# Patient Record
Sex: Male | Born: 1945 | Race: White | Hispanic: No | Marital: Married | Smoking: Former smoker
Health system: Southern US, Community
[De-identification: ages and names within clinical notes are randomized; demographics above are authoritative.]

## PROBLEM LIST (undated history)

## (undated) DIAGNOSIS — Z8489 Family history of other specified conditions: Secondary | ICD-10-CM

## (undated) DIAGNOSIS — Z955 Presence of coronary angioplasty implant and graft: Secondary | ICD-10-CM

## (undated) DIAGNOSIS — Z974 Presence of external hearing-aid: Secondary | ICD-10-CM

## (undated) DIAGNOSIS — K219 Gastro-esophageal reflux disease without esophagitis: Secondary | ICD-10-CM

## (undated) DIAGNOSIS — I2510 Atherosclerotic heart disease of native coronary artery without angina pectoris: Secondary | ICD-10-CM

## (undated) DIAGNOSIS — E785 Hyperlipidemia, unspecified: Secondary | ICD-10-CM

## (undated) DIAGNOSIS — I219 Acute myocardial infarction, unspecified: Secondary | ICD-10-CM

## (undated) DIAGNOSIS — I10 Essential (primary) hypertension: Secondary | ICD-10-CM

## (undated) DIAGNOSIS — H9190 Unspecified hearing loss, unspecified ear: Secondary | ICD-10-CM

## (undated) DIAGNOSIS — M353 Polymyalgia rheumatica: Secondary | ICD-10-CM

## (undated) DIAGNOSIS — Z8619 Personal history of other infectious and parasitic diseases: Secondary | ICD-10-CM

## (undated) HISTORY — PX: CARPAL TUNNEL RELEASE: SHX101

## (undated) HISTORY — PX: CORONARY ANGIOPLASTY: SHX604

## (undated) HISTORY — PX: COLONOSCOPY: SHX174

---

## 2019-04-28 ENCOUNTER — Ambulatory Visit
Admission: RE | Admit: 2019-04-28 | Discharge: 2019-04-28 | Disposition: A | Discharge status: Home / Self Care | Payer: Medicare PPO | Source: Ambulatory Visit | Attending: Family Medicine | Admitting: Family Medicine

## 2019-04-28 ENCOUNTER — Other Ambulatory Visit: Payer: Self-pay

## 2019-04-28 ENCOUNTER — Ambulatory Visit
Admission: RE | Admit: 2019-04-28 | Discharge: 2019-04-28 | Disposition: A | Discharge status: Home / Self Care | Payer: Medicare PPO | Attending: Family Medicine | Admitting: Family Medicine

## 2019-04-28 ENCOUNTER — Other Ambulatory Visit: Payer: Self-pay | Admitting: Family Medicine

## 2019-04-28 DIAGNOSIS — G8929 Other chronic pain: Secondary | ICD-10-CM

## 2019-04-28 DIAGNOSIS — M25511 Pain in right shoulder: Secondary | ICD-10-CM | POA: Insufficient documentation

## 2020-02-14 ENCOUNTER — Encounter: Payer: Self-pay | Admitting: Ophthalmology

## 2020-02-14 ENCOUNTER — Other Ambulatory Visit: Payer: Self-pay

## 2020-02-17 ENCOUNTER — Other Ambulatory Visit
Admission: RE | Admit: 2020-02-17 | Discharge: 2020-02-17 | Disposition: A | Discharge status: Home / Self Care | Payer: Medicare Other | Source: Ambulatory Visit | Attending: Ophthalmology | Admitting: Ophthalmology

## 2020-02-17 ENCOUNTER — Other Ambulatory Visit: Payer: Self-pay

## 2020-02-17 DIAGNOSIS — Z20822 Contact with and (suspected) exposure to covid-19: Secondary | ICD-10-CM | POA: Diagnosis not present

## 2020-02-17 DIAGNOSIS — Z01812 Encounter for preprocedural laboratory examination: Secondary | ICD-10-CM | POA: Diagnosis present

## 2020-02-17 LAB — SARS CORONAVIRUS 2 (TAT 6-24 HRS): SARS Coronavirus 2: NEGATIVE

## 2020-02-17 NOTE — Discharge Instructions (Signed)

## 2020-02-21 ENCOUNTER — Encounter: Payer: Self-pay | Admitting: Ophthalmology

## 2020-02-21 ENCOUNTER — Other Ambulatory Visit: Payer: Self-pay

## 2020-02-21 ENCOUNTER — Ambulatory Visit
Admission: RE | Admit: 2020-02-21 | Discharge: 2020-02-21 | Disposition: A | Discharge status: Home / Self Care | Payer: Medicare Other | Attending: Ophthalmology | Admitting: Ophthalmology

## 2020-02-21 ENCOUNTER — Ambulatory Visit: Payer: Medicare Other | Admitting: Anesthesiology

## 2020-02-21 ENCOUNTER — Encounter
Admission: RE | Disposition: A | Discharge status: Home / Self Care | Payer: Self-pay | Source: Home / Self Care | Attending: Ophthalmology

## 2020-02-21 DIAGNOSIS — H9190 Unspecified hearing loss, unspecified ear: Secondary | ICD-10-CM | POA: Insufficient documentation

## 2020-02-21 DIAGNOSIS — I2510 Atherosclerotic heart disease of native coronary artery without angina pectoris: Secondary | ICD-10-CM | POA: Insufficient documentation

## 2020-02-21 DIAGNOSIS — E785 Hyperlipidemia, unspecified: Secondary | ICD-10-CM | POA: Diagnosis not present

## 2020-02-21 DIAGNOSIS — Z955 Presence of coronary angioplasty implant and graft: Secondary | ICD-10-CM | POA: Diagnosis not present

## 2020-02-21 DIAGNOSIS — Z7982 Long term (current) use of aspirin: Secondary | ICD-10-CM | POA: Insufficient documentation

## 2020-02-21 DIAGNOSIS — Z79899 Other long term (current) drug therapy: Secondary | ICD-10-CM | POA: Insufficient documentation

## 2020-02-21 DIAGNOSIS — Z87891 Personal history of nicotine dependence: Secondary | ICD-10-CM | POA: Insufficient documentation

## 2020-02-21 DIAGNOSIS — I252 Old myocardial infarction: Secondary | ICD-10-CM | POA: Insufficient documentation

## 2020-02-21 DIAGNOSIS — Z791 Long term (current) use of non-steroidal anti-inflammatories (NSAID): Secondary | ICD-10-CM | POA: Diagnosis not present

## 2020-02-21 DIAGNOSIS — K219 Gastro-esophageal reflux disease without esophagitis: Secondary | ICD-10-CM | POA: Diagnosis not present

## 2020-02-21 DIAGNOSIS — H2511 Age-related nuclear cataract, right eye: Secondary | ICD-10-CM | POA: Diagnosis present

## 2020-02-21 DIAGNOSIS — I10 Essential (primary) hypertension: Secondary | ICD-10-CM | POA: Insufficient documentation

## 2020-02-21 DIAGNOSIS — M353 Polymyalgia rheumatica: Secondary | ICD-10-CM | POA: Diagnosis not present

## 2020-02-21 HISTORY — DX: Hyperlipidemia, unspecified: E78.5

## 2020-02-21 HISTORY — DX: Presence of coronary angioplasty implant and graft: Z95.5

## 2020-02-21 HISTORY — DX: Personal history of other infectious and parasitic diseases: Z86.19

## 2020-02-21 HISTORY — DX: Presence of external hearing-aid: Z97.4

## 2020-02-21 HISTORY — DX: Polymyalgia rheumatica: M35.3

## 2020-02-21 HISTORY — DX: Gastro-esophageal reflux disease without esophagitis: K21.9

## 2020-02-21 HISTORY — PX: CATARACT EXTRACTION W/PHACO: SHX586

## 2020-02-21 HISTORY — DX: Acute myocardial infarction, unspecified: I21.9

## 2020-02-21 HISTORY — DX: Essential (primary) hypertension: I10

## 2020-02-21 HISTORY — DX: Atherosclerotic heart disease of native coronary artery without angina pectoris: I25.10

## 2020-02-21 HISTORY — DX: Family history of other specified conditions: Z84.89

## 2020-02-21 HISTORY — DX: Unspecified hearing loss, unspecified ear: H91.90

## 2020-02-21 SURGERY — PHACOEMULSIFICATION, CATARACT, WITH IOL INSERTION
Anesthesia: Monitor Anesthesia Care | Site: Eye | Laterality: Right

## 2020-02-21 MED ORDER — FENTANYL CITRATE (PF) 100 MCG/2ML IJ SOLN
INTRAMUSCULAR | Status: DC | PRN
  Administered 2020-02-21: 50 ug via INTRAVENOUS

## 2020-02-21 MED ORDER — SODIUM HYALURONATE 23 MG/ML IO SOLN
INTRAOCULAR | Status: DC | PRN
  Administered 2020-02-21: 0.6 mL via INTRAOCULAR

## 2020-02-21 MED ORDER — ARMC OPHTHALMIC DILATING DROPS
1.0000 "application " | OPHTHALMIC | Status: DC | PRN
  Administered 2020-02-21 (×3): 1 via OPHTHALMIC

## 2020-02-21 MED ORDER — MOXIFLOXACIN HCL 0.5 % OP SOLN
OPHTHALMIC | Status: DC | PRN
  Administered 2020-02-21: 0.2 mL via OPHTHALMIC

## 2020-02-21 MED ORDER — LIDOCAINE HCL (PF) 2 % IJ SOLN
INTRAOCULAR | Status: DC | PRN
  Administered 2020-02-21: 1 mL via INTRAOCULAR

## 2020-02-21 MED ORDER — SODIUM HYALURONATE 10 MG/ML IO SOLN
INTRAOCULAR | Status: DC | PRN
  Administered 2020-02-21: 0.55 mL via INTRAOCULAR

## 2020-02-21 MED ORDER — EPINEPHRINE PF 1 MG/ML IJ SOLN
INTRAOCULAR | Status: DC | PRN
  Administered 2020-02-21: 62 mL via OPHTHALMIC

## 2020-02-21 MED ORDER — LACTATED RINGERS IV SOLN: INTRAVENOUS | Status: DC

## 2020-02-21 MED ORDER — MIDAZOLAM HCL 2 MG/2ML IJ SOLN
INTRAMUSCULAR | Status: DC | PRN
  Administered 2020-02-21 (×2): 1 mg via INTRAVENOUS

## 2020-02-21 MED ORDER — TETRACAINE HCL 0.5 % OP SOLN
1.0000 [drp] | OPHTHALMIC | Status: DC | PRN
  Administered 2020-02-21 (×3): 1 [drp] via OPHTHALMIC

## 2020-02-21 SURGICAL SUPPLY — 19 items
CANNULA ANT/CHMB 27GA (MISCELLANEOUS) ×4 IMPLANT
DISSECTOR HYDRO NUCLEUS 50X22 (MISCELLANEOUS) ×2 IMPLANT
GLOVE SURG LX 7.5 STRW (GLOVE) ×1
GLOVE SURG LX STRL 7.5 STRW (GLOVE) ×1 IMPLANT
GLOVE SURG SYN 8.5  E (GLOVE) ×1
GLOVE SURG SYN 8.5 E (GLOVE) ×1 IMPLANT
GOWN STRL REUS W/ TWL LRG LVL3 (GOWN DISPOSABLE) ×2 IMPLANT
GOWN STRL REUS W/TWL LRG LVL3 (GOWN DISPOSABLE) ×4
LENS IOL EYHANCE TORIC II 17.5 ×2 IMPLANT
LENS IOL EYHANCE TRC 150 17.5 ×1 IMPLANT
LENS IOL EYHNC TORIC 150 17.5 ×1 IMPLANT
MARKER SKIN DUAL TIP RULER LAB (MISCELLANEOUS) ×2 IMPLANT
PACK DR. KING ARMS (PACKS) ×2 IMPLANT
PACK EYE AFTER SURG (MISCELLANEOUS) ×2 IMPLANT
PACK OPTHALMIC (MISCELLANEOUS) ×2 IMPLANT
SYR 3ML LL SCALE MARK (SYRINGE) ×2 IMPLANT
SYR TB 1ML LUER SLIP (SYRINGE) ×2 IMPLANT
WATER STERILE IRR 250ML POUR (IV SOLUTION) ×2 IMPLANT
WIPE NON LINTING 3.25X3.25 (MISCELLANEOUS) ×2 IMPLANT

## 2020-02-21 NOTE — Anesthesia Procedure Notes (Signed)
Procedure Name: MAC Date/Time: 02/21/2020 8:31 AM Performed by: Georga Bora, CRNA Pre-anesthesia Checklist: Patient identified, Emergency Drugs available, Suction available, Patient being monitored and Timeout performed Oxygen Delivery Method: Nasal cannula

## 2020-02-21 NOTE — Anesthesia Postprocedure Evaluation (Signed)
Anesthesia Post Note  Patient: Corey Rice  Procedure(s) Performed: CATARACT EXTRACTION PHACO AND INTRAOCULAR LENS PLACEMENT (IOC) RIGHT EYHANCE TORIC 2.39  00:25.2 (Right Eye)     Patient location during evaluation: PACU Anesthesia Type: MAC Level of consciousness: awake and alert Pain management: pain level controlled Vital Signs Assessment: post-procedure vital signs reviewed and stable Respiratory status: spontaneous breathing Cardiovascular status: blood pressure returned to baseline Postop Assessment: no apparent nausea or vomiting, adequate PO intake and no headache Anesthetic complications: no   No complications documented.  Adele Barthel Raechel Marcos

## 2020-02-21 NOTE — Transfer of Care (Signed)
Immediate Anesthesia Transfer of Care Note  Patient: Corey Rice  Procedure(s) Performed: CATARACT EXTRACTION PHACO AND INTRAOCULAR LENS PLACEMENT (IOC) RIGHT EYHANCE TORIC 2.39  00:25.2 (Right Eye)  Patient Location: PACU  Anesthesia Type: MAC  Level of Consciousness: awake, alert  and patient cooperative  Airway and Oxygen Therapy: Patient Spontanous Breathing and Patient connected to supplemental oxygen  Post-op Assessment: Post-op Vital signs reviewed, Patient's Cardiovascular Status Stable, Respiratory Function Stable, Patent Airway and No signs of Nausea or vomiting  Post-op Vital Signs: Reviewed and stable  Complications: No complications documented.

## 2020-02-21 NOTE — H&P (Signed)
[name]   Primary Care Physician:  Rayetta Humphrey, MD Ophthalmologist: Dr. Willey Blade  Pre-Procedure History & Physical: HPI:  Corey Rice is a 74 y.o. male here for cataract surgery.   Past Medical History:  Diagnosis Date  . Coronary artery disease   . Family history of adverse reaction to anesthesia    Son - rash  . GERD (gastroesophageal reflux disease)   . History of shingles   . HOH (hard of hearing)   . Hyperlipidemia   . Hypertension   . Myocardial infarction (HCC)   . Polymyalgia rheumatica (HCC)   . Stented coronary artery   . Wears hearing aid in both ears     Past Surgical History:  Procedure Laterality Date  . CARPAL TUNNEL RELEASE    . COLONOSCOPY    . CORONARY ANGIOPLASTY     stent    Prior to Admission medications   Medication Sig Start Date End Date Taking? Authorizing Provider  ACETAMINOPHEN PO Take by mouth as needed.   Yes [provider]  amLODipine (NORVASC) 10 MG tablet Take 10 mg by mouth daily.   Yes [provider]  ASPIRIN 81 PO Take by mouth daily.   Yes [provider]  atorvastatin (LIPITOR) 20 MG tablet Take 20 mg by mouth daily.   Yes [provider]  carvedilol (COREG) 3.125 MG tablet Take 3.125 mg by mouth 2 (two) times daily with a meal.   Yes [provider]  Chondroitin Sulfate 400 MG CAPS Take 1,200 mg by mouth 2 (two) times daily.   Yes [provider]  Coenzyme Q10 (CO Q-10) 100 MG CAPS Take by mouth 2 (two) times daily.   Yes [provider]  DIPHENHYDRAMINE HCL PO Take by mouth as needed.   Yes [provider]  dutasteride (AVODART) 0.5 MG capsule Take 0.5 mg by mouth daily.   Yes [provider]  ezetimibe (ZETIA) 10 MG tablet Take 10 mg by mouth daily.   Yes [provider]  Glucosamine Sulfate 1500 MG PACK Take by mouth 2 (two) times daily.   Yes [provider]  ibuprofen (ADVIL) 600 MG tablet Take 600 mg by  mouth 3 (three) times daily.   Yes [provider]  losartan (COZAAR) 100 MG tablet Take 100 mg by mouth daily.   Yes [provider]  Multiple Vitamins-Minerals (PRESERVISION AREDS PO) Take by mouth 2 (two) times daily.   Yes [provider]  Omega-3 Fatty Acids (FISH OIL PO) Take 300 mg by mouth daily.   Yes [provider]  omeprazole (PRILOSEC) 20 MG capsule Take 20 mg by mouth daily.   Yes [provider]  PSEUDOEPHEDRINE HCL PO Take by mouth as needed.   Yes [provider]  Saw Palmetto, Serenoa repens, (SAW PALMETTO PO) Take by mouth 2 (two) times daily.   Yes [provider]  Vitamin D, Cholecalciferol, 10 MCG (400 UNIT) TABS Take by mouth daily.   Yes [provider]    Allergies as of 01/06/2020  . (Not on File)    History reviewed. No pertinent family history.  Social History   Socioeconomic History  . Marital status: Married    Spouse name: Not on file  . Number of children: Not on file  . Years of education: Not on file  . Highest education level: Not on file  Occupational History  . Not on file  Tobacco Use  . Smoking status: Former  Smoker    Quit date: 02/2019    Years since quitting: 1.0  . Smokeless tobacco: Never Used  Vaping Use  . Vaping Use: Never used  Substance and Sexual Activity  . Alcohol use: Yes    Alcohol/week: 21.0 standard drinks    Types: 7 Glasses of wine, 14 Shots of liquor per week  . Drug use: Not on file  . Sexual activity: Not on file  Other Topics Concern  . Not on file  Social History Narrative  . Not on file   Social Determinants of Health   Financial Resource Strain:   . Difficulty of Paying Living Expenses: Not on file  Food Insecurity:   . Worried About Programme researcher, broadcasting/film/video in the Last Year: Not on file  . Ran Out of Food in the Last Year: Not on file  Transportation Needs:   . Lack of Transportation (Medical): Not on file  . Lack of Transportation  (Non-Medical): Not on file  Physical Activity:   . Days of Exercise per Week: Not on file  . Minutes of Exercise per Session: Not on file  Stress:   . Feeling of Stress : Not on file  Social Connections:   . Frequency of Communication with Friends and Family: Not on file  . Frequency of Social Gatherings with Friends and Family: Not on file  . Attends Religious Services: Not on file  . Active Member of Clubs or Organizations: Not on file  . Attends Banker Meetings: Not on file  . Marital Status: Not on file  Intimate Partner Violence:   . Fear of Current or Ex-Partner: Not on file  . Emotionally Abused: Not on file  . Physically Abused: Not on file  . Sexually Abused: Not on file    Review of Systems: See HPI, otherwise negative ROS  Physical Exam: BP (!) 174/82   Pulse (!) 52   Temp (!) 97.3 F (36.3 C) (Temporal)   Resp 16   Ht 5\' 5"  (1.651 m)   Wt 77.6 kg   SpO2 97%   BMI 28.46 kg/m  General:   Alert,  pleasant and cooperative in NAD Head:  Normocephalic and atraumatic.  Impression/Plan: Corey Rice is here for cataract surgery.  Risks, benefits, limitations, and alternatives regarding cataract surgery have been reviewed with the patient.  Questions have been answered.  All parties agreeable.   Joaquim Nam, MD  02/21/2020, 8:17 AM

## 2020-02-21 NOTE — Op Note (Signed)
OPERATIVE NOTE  Corey Rice [id number] 02/21/2020   PREOPERATIVE DIAGNOSIS:  Nuclear sclerotic cataract right eye.  H25.11   POSTOPERATIVE DIAGNOSIS:    Nuclear sclerotic cataract right eye.     PROCEDURE:  Phacoemusification with posterior chamber intraocular lens placement of the right eye   LENS:   Implant Name Type Inv. Item Serial No. Manufacturer Lot No. LRB No. Used Action  Tecnis Eyhance Toric II IOL Intraocular Lens  [id number]   Right 1 Implanted       Procedure(s): CATARACT EXTRACTION PHACO AND INTRAOCULAR LENS PLACEMENT (IOC) RIGHT EYHANCE TORIC 2.39  00:25.2 (Right)  DIU150 +17.5 (1.5D) @ 001   ULTRASOUND TIME: 0 minutes 25 seconds.  CDE 2.39   SURGEON:  Willey Blade, MD, MPH  ANESTHESIOLOGIST: Anesthesiologist: Page, Wille Celeste, MD CRNA: Arlice Colt, CRNA   ANESTHESIA:  Topical with tetracaine drops augmented with 1% preservative-free intracameral lidocaine.  ESTIMATED BLOOD LOSS: less than 1 mL.   COMPLICATIONS:  None.   DESCRIPTION OF PROCEDURE:  The patient was identified in the holding room and transported to the operating room and placed in the supine position under the operating microscope.  The right eye was identified as the operative eye and it was prepped and draped in the usual sterile ophthalmic fashion.  The VERION system was registered without difficulty.   A 1.0 millimeter clear-corneal paracentesis was made at the 10:30 position. 0.5 ml of preservative-free 1% lidocaine with epinephrine was injected into the anterior chamber.  The anterior chamber was filled with Healon 5 viscoelastic.  A 2.4 millimeter keratome was used to make a near-clear corneal incision at the 8:00 position.  A curvilinear capsulorrhexis was made with a cystotome and capsulorrhexis forceps.  Balanced salt solution was used to hydrodissect and hydrodelineate the nucleus.   Phacoemulsification was then used in stop and chop fashion to remove the lens nucleus and  epinucleus.  The remaining cortex was then removed using the irrigation and aspiration handpiece. Healon was then placed into the capsular bag to distend it for lens placement.  A lens was then injected into the capsular bag.  The remaining viscoelastic was aspirated.  The lens was rotated to 001 degrees with assistance for alignment from the VERION system.   Wounds were hydrated with balanced salt solution.  The anterior chamber was inflated to a physiologic pressure with balanced salt solution.   Intracameral vigamox 0.1 mL undiluted was injected into the eye and a drop placed onto the ocular surface.  No wound leaks were noted.  The patient was taken to the recovery room in stable condition without complications of anesthesia or surgery  Willey Blade 02/21/2020, 8:51 AM

## 2020-02-21 NOTE — Anesthesia Preprocedure Evaluation (Signed)
Anesthesia Evaluation    Airway Mallampati: III  TM Distance: >3 FB Neck ROM: Full    Dental no notable dental hx.    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, symptoms well controlled   Abdominal   Peds  Hematology   Anesthesia Other Findings   Reproductive/Obstetrics                             Anesthesia Physical Anesthesia Plan  ASA: III  Anesthesia Plan: MAC   Post-op Pain Management:    Induction: Intravenous  PONV Risk Score and Plan: 1 and Midazolam, TIVA and Treatment may vary due to age or medical condition  Airway Management Planned: Nasal Cannula and Natural Airway  Additional Equipment: None  Intra-op Plan:   Post-operative Plan:   Informed Consent: I have reviewed the patients History and Physical, chart, labs and discussed the procedure including the risks, benefits and alternatives for the proposed anesthesia with the patient or authorized representative who has indicated his/her understanding and acceptance.       Plan Discussed with: CRNA  Anesthesia Plan Comments:         Anesthesia Quick Evaluation

## 2020-02-22 ENCOUNTER — Encounter: Payer: Self-pay | Admitting: Ophthalmology

## 2020-03-06 ENCOUNTER — Encounter: Payer: Self-pay | Admitting: Ophthalmology

## 2020-03-06 ENCOUNTER — Other Ambulatory Visit: Payer: Self-pay

## 2020-03-09 ENCOUNTER — Other Ambulatory Visit
Admission: RE | Admit: 2020-03-09 | Discharge: 2020-03-09 | Disposition: A | Discharge status: Home / Self Care | Payer: Medicare Other | Source: Ambulatory Visit | Attending: Ophthalmology | Admitting: Ophthalmology

## 2020-03-09 ENCOUNTER — Other Ambulatory Visit: Payer: Self-pay

## 2020-03-09 DIAGNOSIS — Z01812 Encounter for preprocedural laboratory examination: Secondary | ICD-10-CM | POA: Diagnosis present

## 2020-03-09 DIAGNOSIS — Z20822 Contact with and (suspected) exposure to covid-19: Secondary | ICD-10-CM | POA: Diagnosis not present

## 2020-03-10 LAB — SARS CORONAVIRUS 2 (TAT 6-24 HRS): SARS Coronavirus 2: NEGATIVE

## 2020-03-10 NOTE — Discharge Instructions (Signed)

## 2020-03-13 ENCOUNTER — Ambulatory Visit: Payer: Medicare Other | Admitting: Anesthesiology

## 2020-03-13 ENCOUNTER — Ambulatory Visit
Admission: RE | Admit: 2020-03-13 | Discharge: 2020-03-13 | Disposition: A | Discharge status: Home / Self Care | Payer: Medicare Other | Attending: Ophthalmology | Admitting: Ophthalmology

## 2020-03-13 ENCOUNTER — Other Ambulatory Visit: Payer: Self-pay

## 2020-03-13 ENCOUNTER — Encounter: Payer: Self-pay | Admitting: Ophthalmology

## 2020-03-13 ENCOUNTER — Encounter
Admission: RE | Disposition: A | Discharge status: Home / Self Care | Payer: Self-pay | Source: Home / Self Care | Attending: Ophthalmology

## 2020-03-13 DIAGNOSIS — H2512 Age-related nuclear cataract, left eye: Secondary | ICD-10-CM | POA: Insufficient documentation

## 2020-03-13 DIAGNOSIS — Z87891 Personal history of nicotine dependence: Secondary | ICD-10-CM | POA: Insufficient documentation

## 2020-03-13 HISTORY — PX: CATARACT EXTRACTION W/PHACO: SHX586

## 2020-03-13 SURGERY — PHACOEMULSIFICATION, CATARACT, WITH IOL INSERTION
Anesthesia: Monitor Anesthesia Care | Site: Eye | Laterality: Left

## 2020-03-13 MED ORDER — SODIUM HYALURONATE 23 MG/ML IO SOLN
INTRAOCULAR | Status: DC | PRN
  Administered 2020-03-13: 0.6 mL via INTRAOCULAR

## 2020-03-13 MED ORDER — EPINEPHRINE PF 1 MG/ML IJ SOLN
INTRAOCULAR | Status: DC | PRN
  Administered 2020-03-13: 62 mL via OPHTHALMIC

## 2020-03-13 MED ORDER — TETRACAINE HCL 0.5 % OP SOLN
1.0000 [drp] | OPHTHALMIC | Status: DC | PRN
  Administered 2020-03-13 (×3): 1 [drp] via OPHTHALMIC

## 2020-03-13 MED ORDER — LIDOCAINE HCL (PF) 2 % IJ SOLN
INTRAOCULAR | Status: DC | PRN
  Administered 2020-03-13: 1 mL via INTRAOCULAR

## 2020-03-13 MED ORDER — ARMC OPHTHALMIC DILATING DROPS
1.0000 "application " | OPHTHALMIC | Status: DC | PRN
  Administered 2020-03-13 (×3): 1 via OPHTHALMIC

## 2020-03-13 MED ORDER — MOXIFLOXACIN HCL 0.5 % OP SOLN
OPHTHALMIC | Status: DC | PRN
  Administered 2020-03-13: 0.2 mL via OPHTHALMIC

## 2020-03-13 MED ORDER — MIDAZOLAM HCL 2 MG/2ML IJ SOLN
INTRAMUSCULAR | Status: DC | PRN
  Administered 2020-03-13: 2 mg via INTRAVENOUS

## 2020-03-13 MED ORDER — FENTANYL CITRATE (PF) 100 MCG/2ML IJ SOLN
INTRAMUSCULAR | Status: DC | PRN
Start: 2020-03-13 — End: 2020-03-13
  Administered 2020-03-13 (×2): 50 ug via INTRAVENOUS

## 2020-03-13 MED ORDER — SODIUM HYALURONATE 10 MG/ML IO SOLN
INTRAOCULAR | Status: DC | PRN
  Administered 2020-03-13: 0.55 mL via INTRAOCULAR

## 2020-03-13 MED ORDER — LACTATED RINGERS IV SOLN: INTRAVENOUS | Status: DC

## 2020-03-13 SURGICAL SUPPLY — 19 items
CANNULA ANT/CHMB 27GA (MISCELLANEOUS) ×6 IMPLANT
DISSECTOR HYDRO NUCLEUS 50X22 (MISCELLANEOUS) ×3 IMPLANT
GLOVE SURG LX 7.5 STRW (GLOVE) ×4
GLOVE SURG LX STRL 7.5 STRW (GLOVE) ×2 IMPLANT
GLOVE SURG SYN 8.5  E (GLOVE) ×2
GLOVE SURG SYN 8.5 E (GLOVE) ×1 IMPLANT
GOWN STRL REUS W/ TWL LRG LVL3 (GOWN DISPOSABLE) ×2 IMPLANT
GOWN STRL REUS W/TWL LRG LVL3 (GOWN DISPOSABLE) ×6
LENS IOL EYHANCE TORIC II 18.0 ×3 IMPLANT
LENS IOL EYHANCE TRC 150 18.0 ×1 IMPLANT
LENS IOL EYHNC TORIC 150 18.0 ×1 IMPLANT
MARKER SKIN DUAL TIP RULER LAB (MISCELLANEOUS) ×3 IMPLANT
PACK DR. KING ARMS (PACKS) ×3 IMPLANT
PACK EYE AFTER SURG (MISCELLANEOUS) ×3 IMPLANT
PACK OPTHALMIC (MISCELLANEOUS) ×3 IMPLANT
SYR 3ML LL SCALE MARK (SYRINGE) ×3 IMPLANT
SYR TB 1ML LUER SLIP (SYRINGE) ×3 IMPLANT
WATER STERILE IRR 250ML POUR (IV SOLUTION) ×3 IMPLANT
WIPE NON LINTING 3.25X3.25 (MISCELLANEOUS) ×3 IMPLANT

## 2020-03-13 NOTE — Transfer of Care (Signed)
Immediate Anesthesia Transfer of Care Note  Patient: Corey Rice  Procedure(s) Performed: CATARACT EXTRACTION PHACO AND INTRAOCULAR LENS PLACEMENT (IOC) LEFT DAIBETIC EYHANCE TORIC (Left Eye)  Patient Location: PACU  Anesthesia Type: MAC  Level of Consciousness: awake, alert  and patient cooperative  Airway and Oxygen Therapy: Patient Spontanous Breathing and Patient connected to supplemental oxygen  Post-op Assessment: Post-op Vital signs reviewed, Patient's Cardiovascular Status Stable, Respiratory Function Stable, Patent Airway and No signs of Nausea or vomiting  Post-op Vital Signs: Reviewed and stable  Complications: No complications documented.

## 2020-03-13 NOTE — Op Note (Signed)
OPERATIVE NOTE  Adolf Ormiston [id number] 03/13/2020   PREOPERATIVE DIAGNOSIS:  Nuclear sclerotic cataract left eye.  H25.12   POSTOPERATIVE DIAGNOSIS:    Nuclear sclerotic cataract left eye.     PROCEDURE:  Phacoemusification with posterior chamber intraocular lens placement of the left eye   LENS:   Implant Name Type Inv. Item Serial No. Manufacturer Lot No. LRB No. Used Action  LENS IOL EYHANCE TORIC II 18.0 - [id number]  LENS IOL Kizzie Bane II 18.0 [id number]   Left 1 Implanted      Procedure(s) with comments: CATARACT EXTRACTION PHACO AND INTRAOCULAR LENS PLACEMENT (IOC) LEFT DAIBETIC EYHANCE TORIC (Left) - 2.26 0:22.8  DIU150 +18.0 at 177 degrees   ULTRASOUND TIME: 0 minutes 22 seconds.  CDE 2.26   SURGEON:  Willey Blade, MD, MPH   ANESTHESIA:  Topical with tetracaine drops augmented with 1% preservative-free intracameral lidocaine.  ESTIMATED BLOOD LOSS: <1 mL   COMPLICATIONS:  None.   DESCRIPTION OF PROCEDURE:  The patient was identified in the holding room and transported to the operating room and placed in the supine position under the operating microscope.  The left eye was identified as the operative eye and it was prepped and draped in the usual sterile ophthalmic fashion.   The verion system was registered successfully without difficulty.  A 1.0 millimeter clear-corneal paracentesis was made at the 5:00 position. 0.5 ml of preservative-free 1% lidocaine with epinephrine was injected into the anterior chamber.  The anterior chamber was filled with Healon 5 viscoelastic.  A 2.4 millimeter keratome was used to make a near-clear corneal incision at the 2:00 position.  A curvilinear capsulorrhexis was made with a cystotome and capsulorrhexis forceps.  Balanced salt solution was used to hydrodissect and hydrodelineate the nucleus.   Phacoemulsification was then used in stop and chop fashion to remove the lens nucleus and epinucleus.  The remaining cortex was  then removed using the irrigation and aspiration handpiece. Healon was then placed into the capsular bag to distend it for lens placement.  A lens was then injected into the capsular bag.  The remaining viscoelastic was aspirated.  The lens was rotated to 177 degrees with alignment assistance from the verion.  The lens was well centered.   Wounds were hydrated with balanced salt solution.  The anterior chamber was inflated to a physiologic pressure with balanced salt solution.  Intracameral vigamox 0.1 mL undiltued was injected into the eye and a drop placed onto the ocular surface.  No wound leaks were noted.  The patient was taken to the recovery room in stable condition without complications of anesthesia or surgery  Willey Blade 03/13/2020, 8:59 AM

## 2020-03-13 NOTE — Anesthesia Postprocedure Evaluation (Signed)
Anesthesia Post Note  Patient: Corey Rice  Procedure(s) Performed: CATARACT EXTRACTION PHACO AND INTRAOCULAR LENS PLACEMENT (IOC) LEFT DAIBETIC EYHANCE TORIC (Left Eye)     Patient location during evaluation: PACU Anesthesia Type: MAC Level of consciousness: awake and alert Pain management: pain level controlled Vital Signs Assessment: post-procedure vital signs reviewed and stable Respiratory status: nonlabored ventilation and spontaneous breathing Cardiovascular status: blood pressure returned to baseline Postop Assessment: no apparent nausea or vomiting Anesthetic complications: no   No complications documented.  Axyl Sitzman [employer]

## 2020-03-13 NOTE — Anesthesia Preprocedure Evaluation (Addendum)
Anesthesia Evaluation  Patient identified by MRN, date of birth, ID band Patient awake    Reviewed: Allergy & Precautions, NPO status , Patient's Chart, lab work & pertinent test results, reviewed documented beta blocker date and time , Unable to perform ROS - Chart review only  Airway Mallampati: III  TM Distance: >3 FB Neck ROM: Full    Dental no notable dental hx.    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, symptoms well controlled   Abdominal Normal abdominal exam  (+)   Peds  Hematology negative hematology ROS (+)   Anesthesia Other Findings   Reproductive/Obstetrics                             Anesthesia Physical  Anesthesia Plan  ASA: III  Anesthesia Plan: MAC   Post-op Pain Management:    Induction: Intravenous  PONV Risk Score and Plan: 1 and Midazolam, TIVA and Treatment may vary due to age or medical condition  Airway Management Planned: Nasal Cannula and Natural Airway  Additional Equipment: None  Intra-op Plan:   Post-operative Plan:   Informed Consent: I have reviewed the patients History and Physical, chart, labs and discussed the procedure including the risks, benefits and alternatives for the proposed anesthesia with the patient or authorized representative who has indicated his/her understanding and acceptance.       Plan Discussed with: CRNA  Anesthesia Plan Comments:         Anesthesia Quick Evaluation

## 2020-03-13 NOTE — Anesthesia Procedure Notes (Signed)
Procedure Name: MAC Date/Time: 03/13/2020 8:40 AM Performed by: Jeannene Patella, CRNA Pre-anesthesia Checklist: Patient identified, Emergency Drugs available, Suction available, Timeout performed and Patient being monitored Patient Re-evaluated:Patient Re-evaluated prior to induction Oxygen Delivery Method: Nasal cannula Placement Confirmation: positive ETCO2

## 2020-03-13 NOTE — H&P (Signed)
[facility]   Primary Care Physician:  Rayetta Humphrey, MD Ophthalmologist: Dr. Willey Blade  Pre-Procedure History & Physical: HPI:  Corey Rice is a 74 y.o. male here for cataract surgery.   Past Medical History:  Diagnosis Date  . Coronary artery disease   . Family history of adverse reaction to anesthesia    Son - rash  . GERD (gastroesophageal reflux disease)   . History of shingles   . HOH (hard of hearing)   . Hyperlipidemia   . Hypertension   . Myocardial infarction (HCC)   . Polymyalgia rheumatica (HCC)   . Stented coronary artery   . Wears hearing aid in both ears     Past Surgical History:  Procedure Laterality Date  . CARPAL TUNNEL RELEASE    . CATARACT EXTRACTION W/PHACO Right 02/21/2020   Procedure: CATARACT EXTRACTION PHACO AND INTRAOCULAR LENS PLACEMENT (IOC) RIGHT EYHANCE TORIC 2.39  00:25.2;  Surgeon: Nevada Crane, MD;  Location: [facility];  Service: Ophthalmology;  Laterality: Right;  . COLONOSCOPY    . CORONARY ANGIOPLASTY     stent    Prior to Admission medications   Medication Sig Start Date End Date Taking? Authorizing Provider  ACETAMINOPHEN PO Take by mouth as needed.   Yes [provider]  amLODipine (NORVASC) 10 MG tablet Take 10 mg by mouth daily.   Yes [provider]  ASPIRIN 81 PO Take by mouth daily.   Yes [provider]  atorvastatin (LIPITOR) 20 MG tablet Take 20 mg by mouth daily.   Yes [provider]  carvedilol (COREG) 3.125 MG tablet Take 3.125 mg by mouth 2 (two) times daily with a meal.   Yes [provider]  Chondroitin Sulfate 400 MG CAPS Take 1,200 mg by mouth 2 (two) times daily.   Yes [provider]  Coenzyme Q10 (CO Q-10) 100 MG CAPS Take by mouth 2 (two) times daily.   Yes [provider]  DIPHENHYDRAMINE HCL PO Take by mouth as needed.   Yes [provider]  dutasteride (AVODART) 0.5 MG capsule Take 0.5 mg by mouth daily.    Yes [provider]  ezetimibe (ZETIA) 10 MG tablet Take 10 mg by mouth daily.   Yes [provider]  Glucosamine Sulfate 1500 MG PACK Take by mouth 2 (two) times daily.   Yes [provider]  ibuprofen (ADVIL) 600 MG tablet Take 600 mg by mouth 3 (three) times daily.   Yes [provider]  losartan (COZAAR) 100 MG tablet Take 100 mg by mouth daily.   Yes [provider]  Multiple Vitamins-Minerals (PRESERVISION AREDS PO) Take by mouth 2 (two) times daily.   Yes [provider]  Omega-3 Fatty Acids (FISH OIL PO) Take 300 mg by mouth daily.   Yes [provider]  omeprazole (PRILOSEC) 20 MG capsule Take 20 mg by mouth daily.   Yes [provider]  PSEUDOEPHEDRINE HCL PO Take by mouth as needed.   Yes [provider]  Saw Palmetto, Serenoa repens, (SAW PALMETTO PO) Take by mouth 2 (two) times daily.   Yes [provider]  Vitamin D, Cholecalciferol, 10 MCG (400 UNIT) TABS Take by mouth daily.   Yes [provider]    Allergies as of 02/23/2020  . (No Known Allergies)    History reviewed. No pertinent family history.  Social History   Socioeconomic History  . Marital status: Married    Spouse name: Not on  file  . Number of children: Not on file  . Years of education: Not on file  . Highest education level: Not on file  Occupational History  . Not on file  Tobacco Use  . Smoking status: Former Smoker    Quit date: 02/2019    Years since quitting: 1.0  . Smokeless tobacco: Never Used  Vaping Use  . Vaping Use: Never used  Substance and Sexual Activity  . Alcohol use: Yes    Alcohol/week: 21.0 standard drinks    Types: 7 Glasses of wine, 14 Shots of liquor per week  . Drug use: Not on file  . Sexual activity: Not on file  Other Topics Concern  . Not on file  Social History Narrative  . Not on file   Social Determinants of Health   Financial Resource Strain:   . Difficulty of  Paying Living Expenses: Not on file  Food Insecurity:   . Worried About Programme researcher, broadcasting/film/video in the Last Year: Not on file  . Ran Out of Food in the Last Year: Not on file  Transportation Needs:   . Lack of Transportation (Medical): Not on file  . Lack of Transportation (Non-Medical): Not on file  Physical Activity:   . Days of Exercise per Week: Not on file  . Minutes of Exercise per Session: Not on file  Stress:   . Feeling of Stress : Not on file  Social Connections:   . Frequency of Communication with Friends and Family: Not on file  . Frequency of Social Gatherings with Friends and Family: Not on file  . Attends Religious Services: Not on file  . Active Member of Clubs or Organizations: Not on file  . Attends Banker Meetings: Not on file  . Marital Status: Not on file  Intimate Partner Violence:   . Fear of Current or Ex-Partner: Not on file  . Emotionally Abused: Not on file  . Physically Abused: Not on file  . Sexually Abused: Not on file    Review of Systems: See HPI, otherwise negative ROS  Physical Exam: BP (!) 158/91   Pulse (!) 57   Temp (!) 97.2 F (36.2 C)   Ht 5\' 5"  (1.651 m)   Wt 77.6 kg   SpO2 96%   BMI 28.46 kg/m  General:   Alert,  pleasant and cooperative in NAD Head:  Normocephalic and atraumatic.  Impression/Plan: Corey Rice is here for cataract surgery.  Risks, benefits, limitations, and alternatives regarding cataract surgery have been reviewed with the patient.  Questions have been answered.  All parties agreeable.   Joaquim Nam, MD  03/13/2020, 8:10 AM

## 2020-03-14 ENCOUNTER — Encounter: Payer: Self-pay | Admitting: Ophthalmology

## 2021-01-09 IMAGING — CR DG SHOULDER 2+V*R*
3 series · 3 of 3 positions shown · non-contrast
Comparison: None.

CLINICAL DATA: Chronic right shoulder pain

EXAM:
RIGHT SHOULDER - 2+ VIEW

[shoulder grashey]
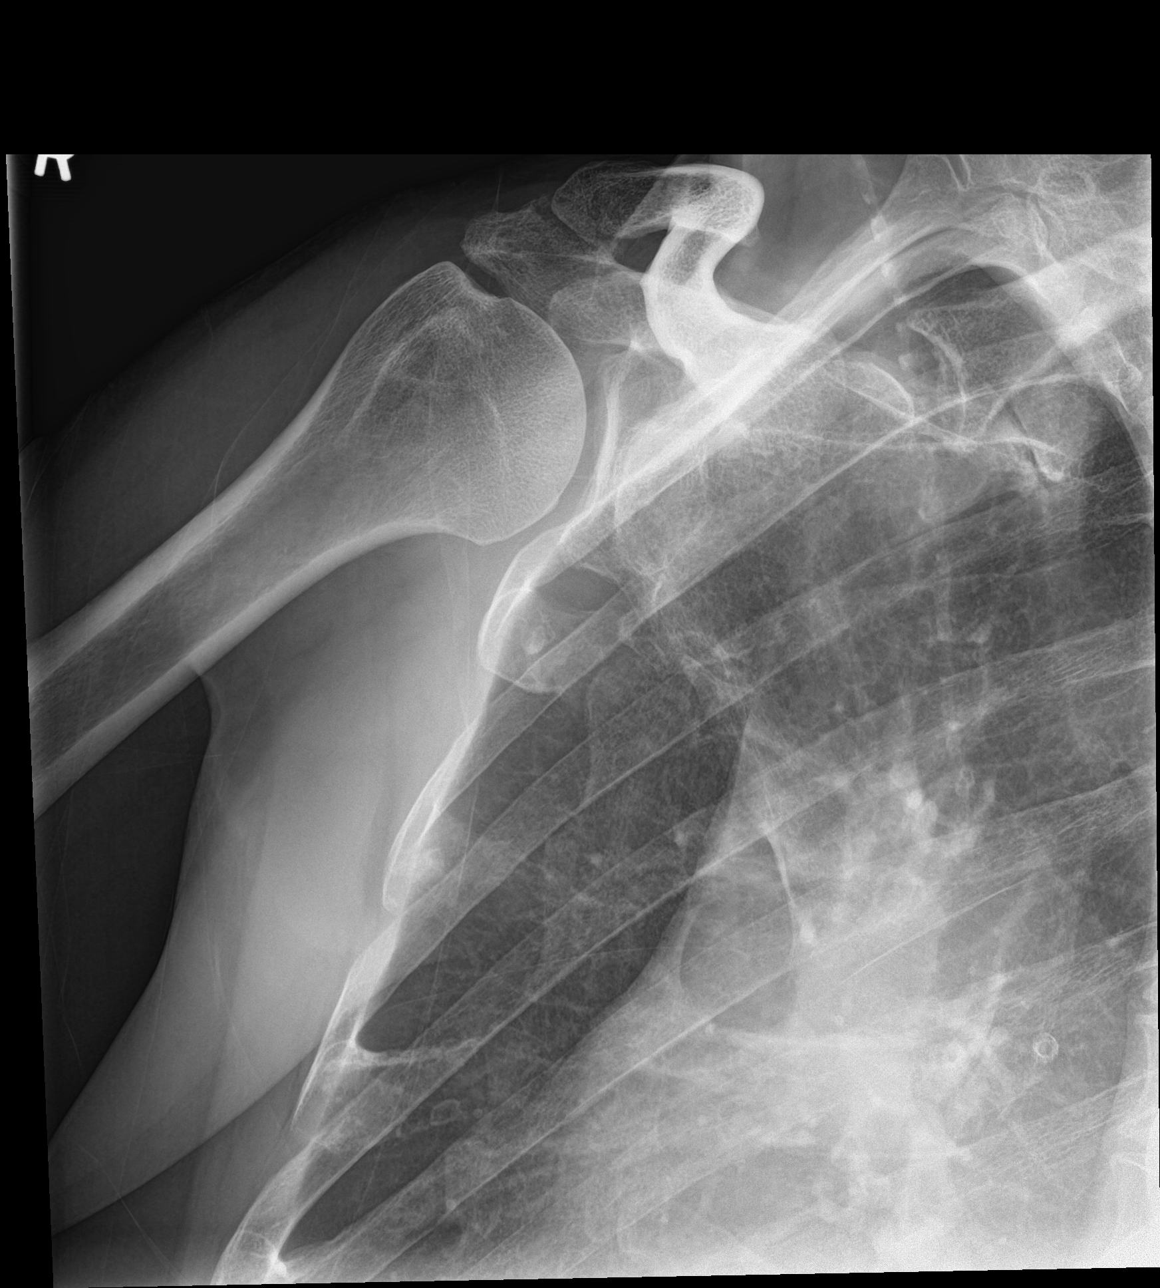

[shoulder y view]
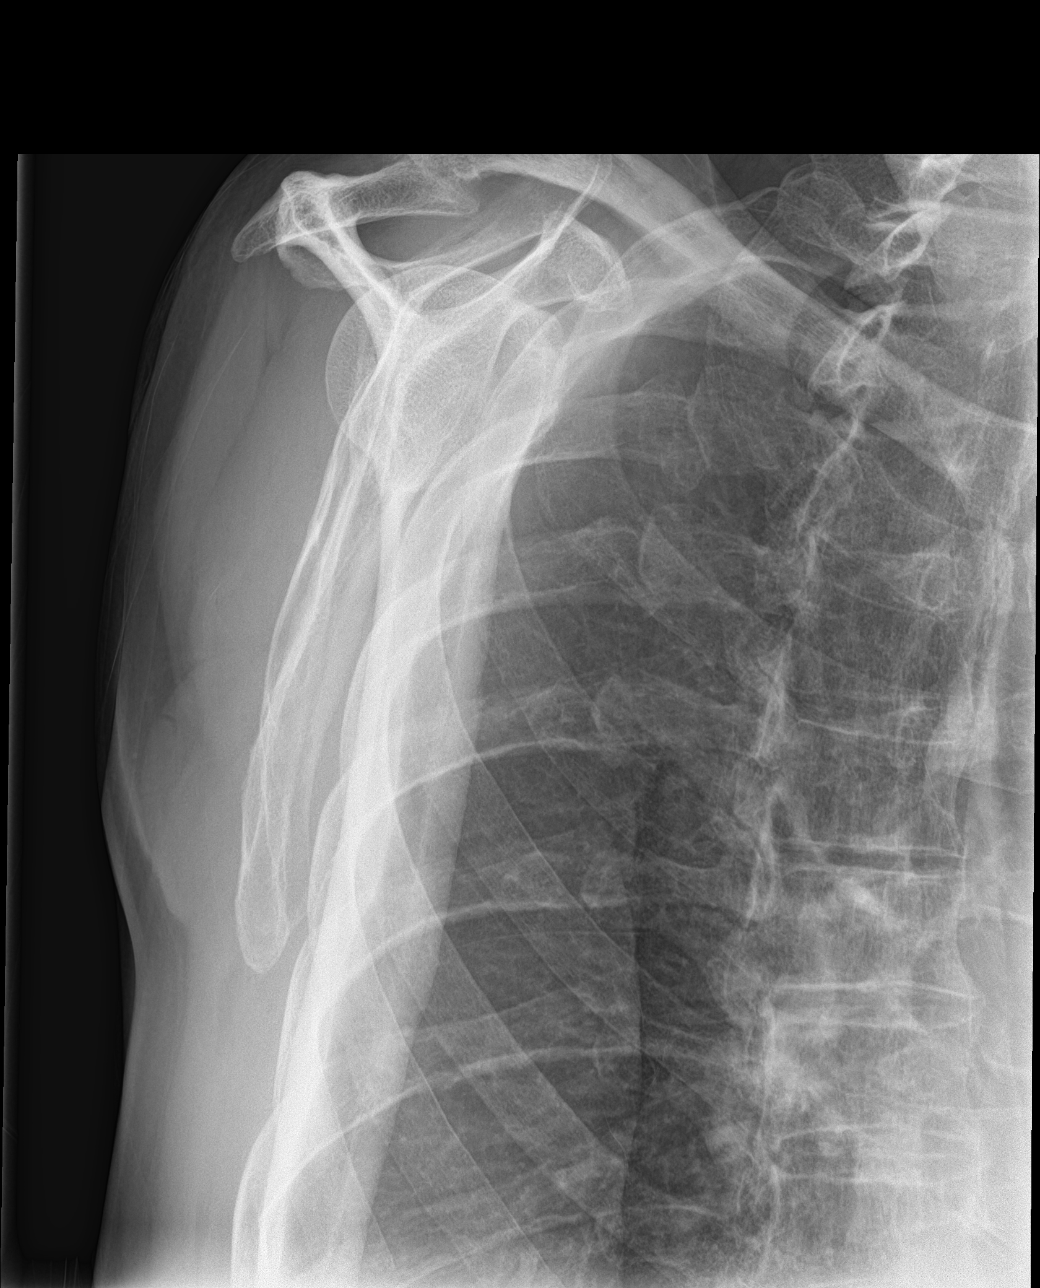

[shoulder axial]
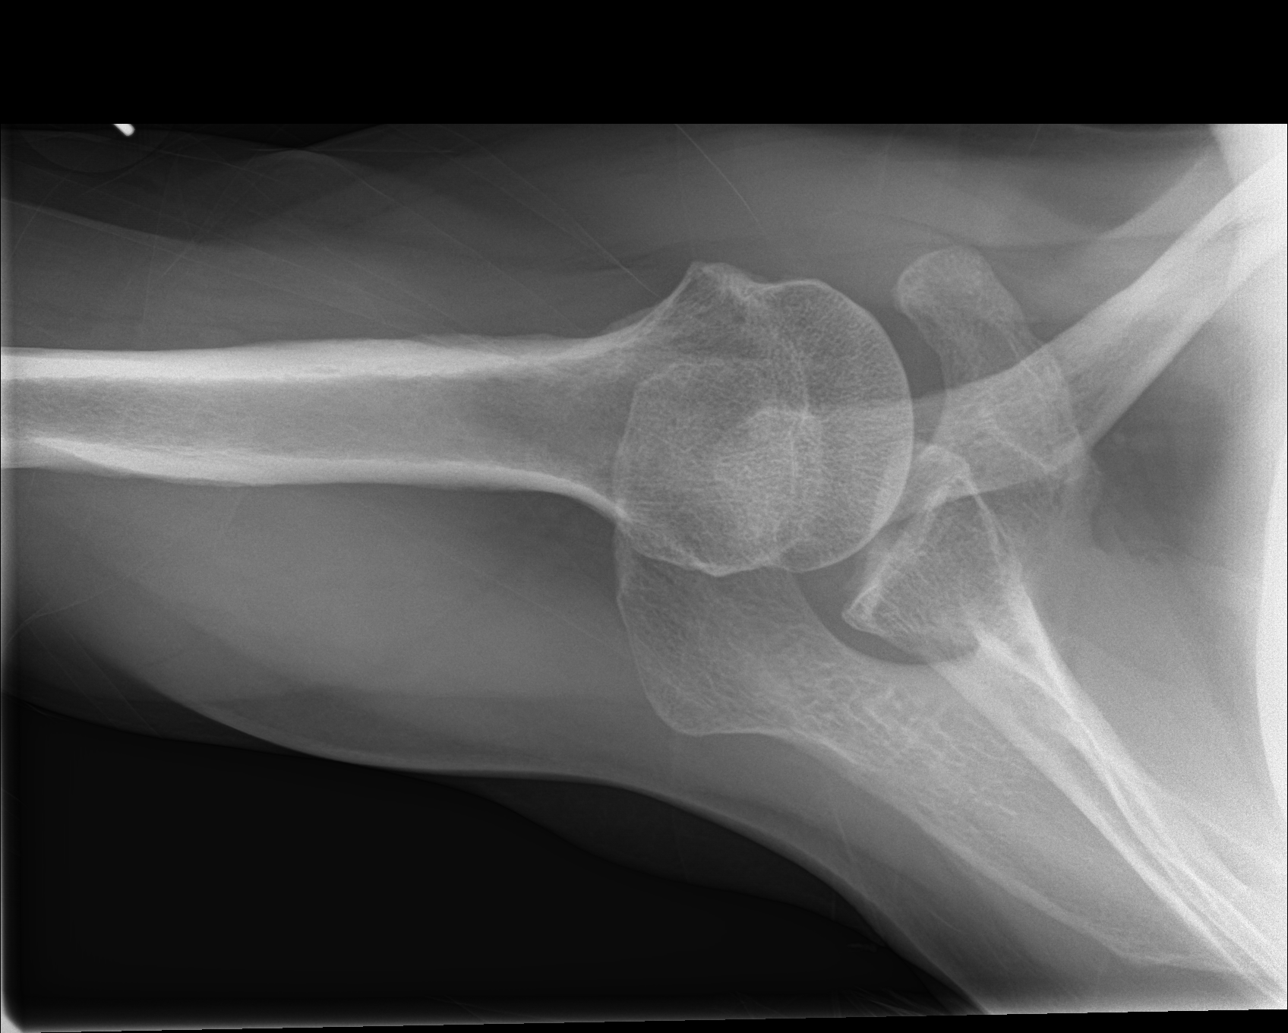

[3 of 3 positions shown; findings below may reference images not displayed]

FINDINGS: Mild AC joint degenerative changes. No fracture or dislocation in
the shoulder. No significant degenerative changes in the shoulder.
IMPRESSION: Mild AC joint degenerative changes.

## 2021-09-06 ENCOUNTER — Other Ambulatory Visit: Payer: Self-pay | Admitting: Physician Assistant

## 2021-09-06 DIAGNOSIS — N63 Unspecified lump in unspecified breast: Secondary | ICD-10-CM

## 2021-09-21 ENCOUNTER — Ambulatory Visit
Admission: RE | Admit: 2021-09-21 | Discharge: 2021-09-21 | Disposition: A | Discharge status: Home / Self Care | Payer: Medicare Other | Source: Ambulatory Visit | Attending: Physician Assistant | Admitting: Physician Assistant

## 2021-09-21 DIAGNOSIS — N63 Unspecified lump in unspecified breast: Secondary | ICD-10-CM

## 2021-09-21 DIAGNOSIS — N62 Hypertrophy of breast: Secondary | ICD-10-CM | POA: Diagnosis not present

## 2021-09-21 DIAGNOSIS — N6310 Unspecified lump in the right breast, unspecified quadrant: Secondary | ICD-10-CM | POA: Diagnosis present

## 2022-09-04 ENCOUNTER — Encounter: Payer: Self-pay | Admitting: Dietician

## 2022-09-04 ENCOUNTER — Encounter: Payer: Medicare Other | Attending: Family Medicine | Admitting: Dietician

## 2022-09-04 VITALS — Ht 66.0 in | Wt 172.9 lb

## 2022-09-04 DIAGNOSIS — Z713 Dietary counseling and surveillance: Secondary | ICD-10-CM | POA: Diagnosis not present

## 2022-09-04 DIAGNOSIS — Z7952 Long term (current) use of systemic steroids: Secondary | ICD-10-CM | POA: Diagnosis not present

## 2022-09-04 DIAGNOSIS — R7303 Prediabetes: Secondary | ICD-10-CM | POA: Diagnosis present

## 2022-09-04 DIAGNOSIS — I10 Essential (primary) hypertension: Secondary | ICD-10-CM | POA: Insufficient documentation

## 2022-09-04 DIAGNOSIS — M069 Rheumatoid arthritis, unspecified: Secondary | ICD-10-CM | POA: Insufficient documentation

## 2022-09-04 DIAGNOSIS — E785 Hyperlipidemia, unspecified: Secondary | ICD-10-CM | POA: Diagnosis not present

## 2022-09-04 DIAGNOSIS — I252 Old myocardial infarction: Secondary | ICD-10-CM | POA: Diagnosis not present

## 2022-09-04 DIAGNOSIS — E119 Type 2 diabetes mellitus without complications: Secondary | ICD-10-CM | POA: Diagnosis present

## 2022-09-04 DIAGNOSIS — R7301 Impaired fasting glucose: Secondary | ICD-10-CM | POA: Insufficient documentation

## 2022-09-04 NOTE — Patient Instructions (Addendum)
Great job making healthy food choices, keep it up! Keep portions of starches like rice, noodles/ pasta, potatoes to about fist-size (1 cup) or less. Continue with regular activity From American [employer] Association, goal for saturated fat would be about 8-10grams daily or less. OK to have about 50-60g fat from healthy (unsaturated) fats daily

## 2022-09-04 NOTE — Progress Notes (Signed)
Medical Nutrition Therapy: Visit start time: 0900  end time: 1000  Assessment:   Referral Diagnosis: prediabetes Other medical history/ diagnoses: hypertension, hyperlipidemia, GERD, rheumatoid arthritis Psychosocial issues/ stress concerns: none  Medications, supplements: reconciled list in medical record   Preferred learning method:  Hands-on    Current weight: 172.9lbs with shoes and leather vest Height: 5'6" BMI: 27.91 Patient's personal weight goal: no specific goal  Progress and evaluation:  Patient reports steroid treatment for past 4 years due to muscular pain/ arthritic pain in hands and feet.   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 Has been following low fat, low cholesterol diet since MI.  Wife has Type 2 diabetes and follows healthy diet for BG control.  Food allergies: bell peppers He reports physical activity increases significantly in spring and summer months, as he maintains a large vegetable and fruit garden. Special diet practices: none Patient seeks help with managing blood sugar/ preventing diabetes, ensuring his diet meets his needs Next PCP appt is 10/10/22   Dietary Intake:  Usual eating pattern includes 3 meals and 1 snacks per day. Dining out frequency: 2 meals per week. Who plans meals/ buys groceries? spouse Who prepares meals? spouse  Breakfast: oatmeal 1/2c + butter seasoning + 1 tsp preserves, honey 1/2c fat free milk, 1/2 c fat free 1/2 & 1/2; biscuit made with heart healthy bisquick, liquid marg, [place] sausage patty, egg beaters with sm amt fat free cheese; fat free milk to take meds, 1c fresh lemonade with stevia Snack: usually none, sometimes nuts Lunch: corn dog with cheese puffs; 1/2 sandwich and cup of soup -- light meal Snack: crackers with peanut butter; nuts Supper: chicken/ turkey/ pork + low carb veg + starch veg often home grown fresh/ home canned  baked okra; stuffed bell pepper with [place] and rice + small orange, small dessert ie cookie with cool whip Snack: none Beverages: water, sugar free decaf tea, seltzer water, sugar free lemonade  Physical activity: walking 30 minutes daily + gardening -- large veg garden   Intervention:   Nutrition Care Education:   Basic nutrition: appropriate nutrient balance; appropriate meal and snack schedule; general nutrition guidelines    Advanced nutrition:  cooking techniques; dining out prediabetes:  goal for HbA1C; appropriate meal and snack schedule; appropriate carb intake and balance, healthy carb choices; role of fiber, protein, fat; physical activity Hypertension: benefits of fruit and vegetable intake Hyperlipidemia: healthy and unhealthy fats; role of fiber, plant sterols from fruit and vegetable intake  Other intervention notes: Patient has been following generally low fat eating pattern since having MI in 2003; he is motivated to continue. He and spouse are making good use of natural, whole foods especially home grown produce No additional dietary changes needed at this time Provided guidance for controlling portions of high-carb foods and inclusion of protein sources with each meal.   Nutritional Diagnosis:  [place]-2.2 Altered nutrition-related laboratory As related to prediabetes.  As evidenced by elevated HbA1C.   Education Materials given:  Designer, industrial/product with food lists, sample meal pattern Visit summary with goals/ instructions   Learner/ who was taught:  Patient   Level of understanding: Verbalizes/ demonstrates competency  Demonstrated degree of understanding via:   Teach back Learning barriers: None  Willingness to learn/ readiness for change: Eager, change in progress   Monitoring and Evaluation:  Dietary intake, exercise, BG control, and body weight      follow  up: prn
